# Patient Record
Sex: Female | Born: 1935 | Race: Black or African American | Hispanic: No
Health system: Southern US, Community
[De-identification: ages and names within clinical notes are randomized; demographics above are authoritative.]

---

## 2013-01-11 ENCOUNTER — Inpatient Hospital Stay
Admission: AD | Admit: 2013-01-11 | Discharge: 2013-02-07 | Disposition: A | Discharge status: Skilled Nursing Facility | Payer: Self-pay | Source: Ambulatory Visit | Attending: Internal Medicine | Admitting: Internal Medicine

## 2013-01-12 ENCOUNTER — Other Ambulatory Visit (HOSPITAL_COMMUNITY): Payer: Self-pay

## 2013-01-12 LAB — CBC
HEMATOCRIT: 31.2 % — AB (ref 36.0–46.0)
Hemoglobin: 9.8 g/dL — ABNORMAL LOW (ref 12.0–15.0)
MCH: 29.7 pg (ref 26.0–34.0)
MCHC: 31.4 g/dL (ref 30.0–36.0)
MCV: 94.5 fL (ref 78.0–100.0)
Platelets: 122 10*3/uL — ABNORMAL LOW (ref 150–400)
RBC: 3.3 MIL/uL — ABNORMAL LOW (ref 3.87–5.11)
RDW: 15.4 % (ref 11.5–15.5)
WBC: 6.6 10*3/uL (ref 4.0–10.5)

## 2013-01-12 LAB — COMPREHENSIVE METABOLIC PANEL
ALK PHOS: 276 U/L — AB (ref 39–117)
ALT: 28 U/L (ref 0–35)
AST: 43 U/L — ABNORMAL HIGH (ref 0–37)
Albumin: 3.2 g/dL — ABNORMAL LOW (ref 3.5–5.2)
BUN: 78 mg/dL — AB (ref 6–23)
CALCIUM: 9.7 mg/dL (ref 8.4–10.5)
CO2: 35 mEq/L — ABNORMAL HIGH (ref 19–32)
CREATININE: 1.38 mg/dL — AB (ref 0.50–1.10)
Chloride: 102 mEq/L (ref 96–112)
GFR calc non Af Amer: 34 mL/min — ABNORMAL LOW (ref >90)
GFR, EST AFRICAN AMERICAN: 40 mL/min — AB (ref >90)
GLUCOSE: 138 mg/dL — AB (ref 70–99)
POTASSIUM: 3.8 meq/L (ref 3.7–5.3)
Sodium: 146 mEq/L (ref 137–147)
TOTAL PROTEIN: 6.9 g/dL (ref 6.0–8.3)
Total Bilirubin: 0.5 mg/dL (ref 0.3–1.2)

## 2013-01-12 LAB — MAGNESIUM: Magnesium: 2.1 mg/dL (ref 1.5–2.5)

## 2013-01-12 LAB — CLOSTRIDIUM DIFFICILE BY PCR: Toxigenic C. Difficile by PCR: NEGATIVE

## 2013-01-12 LAB — PREALBUMIN: PREALBUMIN: 12.4 mg/dL — AB (ref 17.0–34.0)

## 2013-01-12 LAB — PROCALCITONIN: Procalcitonin: 0.17 ng/mL

## 2013-01-13 LAB — PROCALCITONIN: Procalcitonin: 0.14 ng/mL

## 2013-01-14 LAB — BASIC METABOLIC PANEL
BUN: 77 mg/dL — AB (ref 6–23)
CALCIUM: 9.8 mg/dL (ref 8.4–10.5)
CO2: 36 mEq/L — ABNORMAL HIGH (ref 19–32)
Chloride: 101 mEq/L (ref 96–112)
Creatinine, Ser: 1.31 mg/dL — ABNORMAL HIGH (ref 0.50–1.10)
GFR, EST AFRICAN AMERICAN: 42 mL/min — AB (ref >90)
GFR, EST NON AFRICAN AMERICAN: 37 mL/min — AB (ref >90)
GLUCOSE: 127 mg/dL — AB (ref 70–99)
Potassium: 3.7 mEq/L (ref 3.7–5.3)
Sodium: 145 mEq/L (ref 137–147)

## 2013-01-14 LAB — URINALYSIS, ROUTINE W REFLEX MICROSCOPIC
Bilirubin Urine: NEGATIVE
Glucose, UA: NEGATIVE mg/dL
Ketones, ur: NEGATIVE mg/dL
NITRITE: NEGATIVE
PH: 5 (ref 5.0–8.0)
Protein, ur: 100 mg/dL — AB
Specific Gravity, Urine: 1.014 (ref 1.005–1.030)
Urobilinogen, UA: 0.2 mg/dL (ref 0.0–1.0)

## 2013-01-14 LAB — URINE MICROSCOPIC-ADD ON

## 2013-01-16 LAB — URINE CULTURE: Colony Count: 60000

## 2013-01-17 LAB — COMPREHENSIVE METABOLIC PANEL
ALT: 60 U/L — ABNORMAL HIGH (ref 0–35)
AST: 59 U/L — ABNORMAL HIGH (ref 0–37)
Albumin: 3.2 g/dL — ABNORMAL LOW (ref 3.5–5.2)
Alkaline Phosphatase: 375 U/L — ABNORMAL HIGH (ref 39–117)
BUN: 98 mg/dL — ABNORMAL HIGH (ref 6–23)
CALCIUM: 9.7 mg/dL (ref 8.4–10.5)
CO2: 34 mEq/L — ABNORMAL HIGH (ref 19–32)
Chloride: 98 mEq/L (ref 96–112)
Creatinine, Ser: 1.83 mg/dL — ABNORMAL HIGH (ref 0.50–1.10)
GFR calc non Af Amer: 24 mL/min — ABNORMAL LOW (ref >90)
GFR, EST AFRICAN AMERICAN: 28 mL/min — AB (ref >90)
Glucose, Bld: 136 mg/dL — ABNORMAL HIGH (ref 70–99)
Potassium: 4.4 mEq/L (ref 3.7–5.3)
SODIUM: 142 meq/L (ref 137–147)
TOTAL PROTEIN: 7.1 g/dL (ref 6.0–8.3)
Total Bilirubin: 0.4 mg/dL (ref 0.3–1.2)

## 2013-01-17 LAB — CBC
HCT: 29.5 % — ABNORMAL LOW (ref 36.0–46.0)
Hemoglobin: 9.2 g/dL — ABNORMAL LOW (ref 12.0–15.0)
MCH: 30 pg (ref 26.0–34.0)
MCHC: 31.2 g/dL (ref 30.0–36.0)
MCV: 96.1 fL (ref 78.0–100.0)
PLATELETS: 125 10*3/uL — AB (ref 150–400)
RBC: 3.07 MIL/uL — ABNORMAL LOW (ref 3.87–5.11)
RDW: 16 % — AB (ref 11.5–15.5)
WBC: 8.3 10*3/uL (ref 4.0–10.5)

## 2013-01-20 DIAGNOSIS — R609 Edema, unspecified: Secondary | ICD-10-CM

## 2013-01-20 LAB — URINE MICROSCOPIC-ADD ON

## 2013-01-20 LAB — URINALYSIS, ROUTINE W REFLEX MICROSCOPIC
Bilirubin Urine: NEGATIVE
Glucose, UA: NEGATIVE mg/dL
KETONES UR: NEGATIVE mg/dL
Nitrite: NEGATIVE
Specific Gravity, Urine: 1.019 (ref 1.005–1.030)
Urobilinogen, UA: 0.2 mg/dL (ref 0.0–1.0)
pH: 6 (ref 5.0–8.0)

## 2013-01-20 LAB — BASIC METABOLIC PANEL
BUN: 109 mg/dL — ABNORMAL HIGH (ref 6–23)
CHLORIDE: 98 meq/L (ref 96–112)
CO2: 34 meq/L — AB (ref 19–32)
Calcium: 9.4 mg/dL (ref 8.4–10.5)
Creatinine, Ser: 1.84 mg/dL — ABNORMAL HIGH (ref 0.50–1.10)
GFR calc Af Amer: 28 mL/min — ABNORMAL LOW (ref >90)
GFR calc non Af Amer: 24 mL/min — ABNORMAL LOW (ref >90)
Glucose, Bld: 123 mg/dL — ABNORMAL HIGH (ref 70–99)
Potassium: 4.8 mEq/L (ref 3.7–5.3)
Sodium: 141 mEq/L (ref 137–147)

## 2013-01-20 NOTE — Progress Notes (Signed)
VASCULAR LAB PRELIMINARY  PRELIMINARY  PRELIMINARY  PRELIMINARY  Right upper extremity venous Doppler completed.    Preliminary report:  There is no DVT or SVT in the visualized veins of the right upper extremity. Zafir Schauer, RVT 01/20/2013, 4:30 PM

## 2013-01-21 ENCOUNTER — Other Ambulatory Visit (HOSPITAL_COMMUNITY): Payer: Self-pay

## 2013-01-21 LAB — CBC WITH DIFFERENTIAL/PLATELET
BASOS ABS: 0 10*3/uL (ref 0.0–0.1)
Basophils Relative: 0 % (ref 0–1)
Eosinophils Absolute: 0 10*3/uL (ref 0.0–0.7)
Eosinophils Relative: 0 % (ref 0–5)
HEMATOCRIT: 27.6 % — AB (ref 36.0–46.0)
HEMOGLOBIN: 9 g/dL — AB (ref 12.0–15.0)
LYMPHS PCT: 17 % (ref 12–46)
Lymphs Abs: 1.1 10*3/uL (ref 0.7–4.0)
MCH: 30.2 pg (ref 26.0–34.0)
MCHC: 32.6 g/dL (ref 30.0–36.0)
MCV: 92.6 fL (ref 78.0–100.0)
MONO ABS: 1 10*3/uL (ref 0.1–1.0)
Monocytes Relative: 15 % — ABNORMAL HIGH (ref 3–12)
Neutro Abs: 4.5 10*3/uL (ref 1.7–7.7)
Neutrophils Relative %: 68 % (ref 43–77)
Platelets: 140 10*3/uL — ABNORMAL LOW (ref 150–400)
RBC: 2.98 MIL/uL — ABNORMAL LOW (ref 3.87–5.11)
RDW: 16.1 % — ABNORMAL HIGH (ref 11.5–15.5)
WBC: 6.7 10*3/uL (ref 4.0–10.5)

## 2013-01-21 LAB — COMPREHENSIVE METABOLIC PANEL
ALK PHOS: 602 U/L — AB (ref 39–117)
ALT: 71 U/L — ABNORMAL HIGH (ref 0–35)
AST: 66 U/L — AB (ref 0–37)
Albumin: 3.3 g/dL — ABNORMAL LOW (ref 3.5–5.2)
BILIRUBIN TOTAL: 0.5 mg/dL (ref 0.3–1.2)
BUN: 114 mg/dL — ABNORMAL HIGH (ref 6–23)
CHLORIDE: 97 meq/L (ref 96–112)
CO2: 29 meq/L (ref 19–32)
CREATININE: 1.91 mg/dL — AB (ref 0.50–1.10)
Calcium: 9.7 mg/dL (ref 8.4–10.5)
GFR calc Af Amer: 27 mL/min — ABNORMAL LOW (ref >90)
GFR calc non Af Amer: 23 mL/min — ABNORMAL LOW (ref >90)
Glucose, Bld: 100 mg/dL — ABNORMAL HIGH (ref 70–99)
POTASSIUM: 4.6 meq/L (ref 3.7–5.3)
Sodium: 140 mEq/L (ref 137–147)
Total Protein: 7.5 g/dL (ref 6.0–8.3)

## 2013-01-21 LAB — URINE CULTURE
Colony Count: NO GROWTH
Culture: NO GROWTH

## 2013-01-21 LAB — PREALBUMIN: Prealbumin: 14.2 mg/dL — ABNORMAL LOW (ref 17.0–34.0)

## 2013-01-22 LAB — BASIC METABOLIC PANEL
BUN: 118 mg/dL — ABNORMAL HIGH (ref 6–23)
CALCIUM: 9.9 mg/dL (ref 8.4–10.5)
CO2: 33 mEq/L — ABNORMAL HIGH (ref 19–32)
CREATININE: 1.9 mg/dL — AB (ref 0.50–1.10)
Chloride: 101 mEq/L (ref 96–112)
GFR calc Af Amer: 27 mL/min — ABNORMAL LOW (ref >90)
GFR calc non Af Amer: 23 mL/min — ABNORMAL LOW (ref >90)
GLUCOSE: 73 mg/dL (ref 70–99)
Potassium: 4.6 mEq/L (ref 3.7–5.3)
Sodium: 145 mEq/L (ref 137–147)

## 2013-01-23 LAB — COMPREHENSIVE METABOLIC PANEL
ALBUMIN: 3.7 g/dL (ref 3.5–5.2)
ALK PHOS: 648 U/L — AB (ref 39–117)
ALT: 53 U/L — AB (ref 0–35)
AST: 51 U/L — ABNORMAL HIGH (ref 0–37)
BUN: 119 mg/dL — ABNORMAL HIGH (ref 6–23)
CALCIUM: 10.4 mg/dL (ref 8.4–10.5)
CO2: 31 mEq/L (ref 19–32)
Chloride: 100 mEq/L (ref 96–112)
Creatinine, Ser: 1.79 mg/dL — ABNORMAL HIGH (ref 0.50–1.10)
GFR calc Af Amer: 29 mL/min — ABNORMAL LOW (ref >90)
GFR calc non Af Amer: 25 mL/min — ABNORMAL LOW (ref >90)
Glucose, Bld: 69 mg/dL — ABNORMAL LOW (ref 70–99)
POTASSIUM: 4.5 meq/L (ref 3.7–5.3)
SODIUM: 145 meq/L (ref 137–147)
TOTAL PROTEIN: 7.7 g/dL (ref 6.0–8.3)
Total Bilirubin: 0.6 mg/dL (ref 0.3–1.2)

## 2013-01-23 LAB — CBC WITH DIFFERENTIAL/PLATELET
BASOS ABS: 0 10*3/uL (ref 0.0–0.1)
BASOS PCT: 0 % (ref 0–1)
EOS ABS: 0 10*3/uL (ref 0.0–0.7)
EOS PCT: 0 % (ref 0–5)
HCT: 27.9 % — ABNORMAL LOW (ref 36.0–46.0)
Hemoglobin: 9.2 g/dL — ABNORMAL LOW (ref 12.0–15.0)
Lymphocytes Relative: 10 % — ABNORMAL LOW (ref 12–46)
Lymphs Abs: 0.9 10*3/uL (ref 0.7–4.0)
MCH: 30.1 pg (ref 26.0–34.0)
MCHC: 33 g/dL (ref 30.0–36.0)
MCV: 91.2 fL (ref 78.0–100.0)
Monocytes Absolute: 1.3 10*3/uL — ABNORMAL HIGH (ref 0.1–1.0)
Monocytes Relative: 13 % — ABNORMAL HIGH (ref 3–12)
NEUTROS PCT: 77 % (ref 43–77)
Neutro Abs: 7.5 10*3/uL (ref 1.7–7.7)
PLATELETS: 160 10*3/uL (ref 150–400)
RBC: 3.06 MIL/uL — ABNORMAL LOW (ref 3.87–5.11)
RDW: 16.1 % — AB (ref 11.5–15.5)
WBC: 9.7 10*3/uL (ref 4.0–10.5)

## 2013-01-24 LAB — CBC
HCT: 29 % — ABNORMAL LOW (ref 36.0–46.0)
HEMOGLOBIN: 9.2 g/dL — AB (ref 12.0–15.0)
MCH: 29.5 pg (ref 26.0–34.0)
MCHC: 31.7 g/dL (ref 30.0–36.0)
MCV: 92.9 fL (ref 78.0–100.0)
Platelets: 162 10*3/uL (ref 150–400)
RBC: 3.12 MIL/uL — ABNORMAL LOW (ref 3.87–5.11)
RDW: 16.1 % — AB (ref 11.5–15.5)
WBC: 9.8 10*3/uL (ref 4.0–10.5)

## 2013-01-24 LAB — BASIC METABOLIC PANEL
BUN: 123 mg/dL — AB (ref 6–23)
CHLORIDE: 101 meq/L (ref 96–112)
CO2: 32 mEq/L (ref 19–32)
CREATININE: 1.78 mg/dL — AB (ref 0.50–1.10)
Calcium: 10.5 mg/dL (ref 8.4–10.5)
GFR, EST AFRICAN AMERICAN: 29 mL/min — AB (ref >90)
GFR, EST NON AFRICAN AMERICAN: 25 mL/min — AB (ref >90)
GLUCOSE: 141 mg/dL — AB (ref 70–99)
POTASSIUM: 4.4 meq/L (ref 3.7–5.3)
Sodium: 146 mEq/L (ref 137–147)

## 2013-01-25 ENCOUNTER — Other Ambulatory Visit (HOSPITAL_COMMUNITY): Payer: Self-pay

## 2013-01-25 LAB — COMPREHENSIVE METABOLIC PANEL
ALBUMIN: 4 g/dL (ref 3.5–5.2)
ALT: 67 U/L — ABNORMAL HIGH (ref 0–35)
AST: 72 U/L — ABNORMAL HIGH (ref 0–37)
Alkaline Phosphatase: 687 U/L — ABNORMAL HIGH (ref 39–117)
BUN: 128 mg/dL — AB (ref 6–23)
CALCIUM: 10.5 mg/dL (ref 8.4–10.5)
CO2: 31 mEq/L (ref 19–32)
Chloride: 102 mEq/L (ref 96–112)
Creatinine, Ser: 1.75 mg/dL — ABNORMAL HIGH (ref 0.50–1.10)
GFR calc non Af Amer: 26 mL/min — ABNORMAL LOW (ref >90)
GFR, EST AFRICAN AMERICAN: 30 mL/min — AB (ref >90)
GLUCOSE: 193 mg/dL — AB (ref 70–99)
POTASSIUM: 4.1 meq/L (ref 3.7–5.3)
Sodium: 150 mEq/L — ABNORMAL HIGH (ref 137–147)
TOTAL PROTEIN: 8.1 g/dL (ref 6.0–8.3)
Total Bilirubin: 0.7 mg/dL (ref 0.3–1.2)

## 2013-01-25 LAB — CBC
HEMATOCRIT: 29.7 % — AB (ref 36.0–46.0)
HEMOGLOBIN: 9.5 g/dL — AB (ref 12.0–15.0)
MCH: 29.2 pg (ref 26.0–34.0)
MCHC: 32 g/dL (ref 30.0–36.0)
MCV: 91.4 fL (ref 78.0–100.0)
Platelets: 162 10*3/uL (ref 150–400)
RBC: 3.25 MIL/uL — ABNORMAL LOW (ref 3.87–5.11)
RDW: 16 % — AB (ref 11.5–15.5)
WBC: 11 10*3/uL — ABNORMAL HIGH (ref 4.0–10.5)

## 2013-01-25 LAB — PRO B NATRIURETIC PEPTIDE: PRO B NATRI PEPTIDE: 43141 pg/mL — AB (ref 0–450)

## 2013-01-26 ENCOUNTER — Other Ambulatory Visit (HOSPITAL_COMMUNITY): Payer: Self-pay

## 2013-01-26 LAB — CBC
HCT: 29.1 % — ABNORMAL LOW (ref 36.0–46.0)
Hemoglobin: 9.3 g/dL — ABNORMAL LOW (ref 12.0–15.0)
MCH: 29.5 pg (ref 26.0–34.0)
MCHC: 32 g/dL (ref 30.0–36.0)
MCV: 92.4 fL (ref 78.0–100.0)
PLATELETS: 150 10*3/uL (ref 150–400)
RBC: 3.15 MIL/uL — ABNORMAL LOW (ref 3.87–5.11)
RDW: 16.3 % — AB (ref 11.5–15.5)
WBC: 12.4 10*3/uL — ABNORMAL HIGH (ref 4.0–10.5)

## 2013-01-26 LAB — BASIC METABOLIC PANEL
BUN: 135 mg/dL — ABNORMAL HIGH (ref 6–23)
CALCIUM: 10.4 mg/dL (ref 8.4–10.5)
CO2: 33 meq/L — AB (ref 19–32)
CREATININE: 1.79 mg/dL — AB (ref 0.50–1.10)
Chloride: 105 mEq/L (ref 96–112)
GFR calc Af Amer: 29 mL/min — ABNORMAL LOW (ref >90)
GFR calc non Af Amer: 25 mL/min — ABNORMAL LOW (ref >90)
GLUCOSE: 175 mg/dL — AB (ref 70–99)
Potassium: 3.8 mEq/L (ref 3.7–5.3)
Sodium: 152 mEq/L — ABNORMAL HIGH (ref 137–147)

## 2013-01-26 LAB — URINALYSIS, ROUTINE W REFLEX MICROSCOPIC
Bilirubin Urine: NEGATIVE
Glucose, UA: NEGATIVE mg/dL
HGB URINE DIPSTICK: NEGATIVE
KETONES UR: NEGATIVE mg/dL
Nitrite: NEGATIVE
PROTEIN: 30 mg/dL — AB
Specific Gravity, Urine: 1.014 (ref 1.005–1.030)
Urobilinogen, UA: 0.2 mg/dL (ref 0.0–1.0)
pH: 5 (ref 5.0–8.0)

## 2013-01-26 LAB — PHOSPHORUS: Phosphorus: 3.9 mg/dL (ref 2.3–4.6)

## 2013-01-26 LAB — URINE MICROSCOPIC-ADD ON

## 2013-01-26 LAB — MAGNESIUM: Magnesium: 3 mg/dL — ABNORMAL HIGH (ref 1.5–2.5)

## 2013-01-27 ENCOUNTER — Other Ambulatory Visit (HOSPITAL_COMMUNITY): Payer: Self-pay

## 2013-01-27 LAB — BASIC METABOLIC PANEL
BUN: 133 mg/dL — AB (ref 6–23)
CHLORIDE: 103 meq/L (ref 96–112)
CO2: 33 mEq/L — ABNORMAL HIGH (ref 19–32)
CREATININE: 1.73 mg/dL — AB (ref 0.50–1.10)
Calcium: 10.4 mg/dL (ref 8.4–10.5)
GFR calc non Af Amer: 26 mL/min — ABNORMAL LOW (ref >90)
GFR, EST AFRICAN AMERICAN: 30 mL/min — AB (ref >90)
Glucose, Bld: 154 mg/dL — ABNORMAL HIGH (ref 70–99)
Potassium: 3.4 mEq/L — ABNORMAL LOW (ref 3.7–5.3)
Sodium: 152 mEq/L — ABNORMAL HIGH (ref 137–147)

## 2013-01-27 LAB — CBC
HEMATOCRIT: 28.6 % — AB (ref 36.0–46.0)
Hemoglobin: 9.1 g/dL — ABNORMAL LOW (ref 12.0–15.0)
MCH: 29.6 pg (ref 26.0–34.0)
MCHC: 31.8 g/dL (ref 30.0–36.0)
MCV: 93.2 fL (ref 78.0–100.0)
Platelets: 150 10*3/uL (ref 150–400)
RBC: 3.07 MIL/uL — ABNORMAL LOW (ref 3.87–5.11)
RDW: 16.4 % — AB (ref 11.5–15.5)
WBC: 10.5 10*3/uL (ref 4.0–10.5)

## 2013-01-28 LAB — URINE CULTURE: Colony Count: 8000

## 2013-01-28 LAB — CBC WITH DIFFERENTIAL/PLATELET
BASOS ABS: 0 10*3/uL (ref 0.0–0.1)
BASOS PCT: 0 % (ref 0–1)
EOS ABS: 0.1 10*3/uL (ref 0.0–0.7)
EOS PCT: 1 % (ref 0–5)
HEMATOCRIT: 26.9 % — AB (ref 36.0–46.0)
Hemoglobin: 8.5 g/dL — ABNORMAL LOW (ref 12.0–15.0)
Lymphocytes Relative: 12 % (ref 12–46)
Lymphs Abs: 1 10*3/uL (ref 0.7–4.0)
MCH: 29.7 pg (ref 26.0–34.0)
MCHC: 31.6 g/dL (ref 30.0–36.0)
MCV: 94.1 fL (ref 78.0–100.0)
MONO ABS: 0.5 10*3/uL (ref 0.1–1.0)
Monocytes Relative: 6 % (ref 3–12)
NEUTROS ABS: 6.7 10*3/uL (ref 1.7–7.7)
Neutrophils Relative %: 81 % — ABNORMAL HIGH (ref 43–77)
Platelets: 123 10*3/uL — ABNORMAL LOW (ref 150–400)
RBC: 2.86 MIL/uL — ABNORMAL LOW (ref 3.87–5.11)
RDW: 16.4 % — AB (ref 11.5–15.5)
WBC: 8.3 10*3/uL (ref 4.0–10.5)

## 2013-01-28 LAB — BASIC METABOLIC PANEL
BUN: 131 mg/dL — ABNORMAL HIGH (ref 6–23)
CALCIUM: 9.9 mg/dL (ref 8.4–10.5)
CO2: 34 mEq/L — ABNORMAL HIGH (ref 19–32)
CREATININE: 1.78 mg/dL — AB (ref 0.50–1.10)
Chloride: 106 mEq/L (ref 96–112)
GFR calc non Af Amer: 25 mL/min — ABNORMAL LOW (ref >90)
GFR, EST AFRICAN AMERICAN: 29 mL/min — AB (ref >90)
Glucose, Bld: 168 mg/dL — ABNORMAL HIGH (ref 70–99)
Potassium: 3.9 mEq/L (ref 3.7–5.3)
Sodium: 152 mEq/L — ABNORMAL HIGH (ref 137–147)

## 2013-01-28 LAB — PHOSPHORUS: Phosphorus: 3.5 mg/dL (ref 2.3–4.6)

## 2013-01-28 LAB — MAGNESIUM: Magnesium: 2.8 mg/dL — ABNORMAL HIGH (ref 1.5–2.5)

## 2013-01-29 LAB — CBC
HCT: 28.2 % — ABNORMAL LOW (ref 36.0–46.0)
Hemoglobin: 8.9 g/dL — ABNORMAL LOW (ref 12.0–15.0)
MCH: 29.7 pg (ref 26.0–34.0)
MCHC: 31.6 g/dL (ref 30.0–36.0)
MCV: 94 fL (ref 78.0–100.0)
PLATELETS: 137 10*3/uL — AB (ref 150–400)
RBC: 3 MIL/uL — ABNORMAL LOW (ref 3.87–5.11)
RDW: 16.6 % — ABNORMAL HIGH (ref 11.5–15.5)
WBC: 9.5 10*3/uL (ref 4.0–10.5)

## 2013-01-29 LAB — BASIC METABOLIC PANEL
BUN: 129 mg/dL — ABNORMAL HIGH (ref 6–23)
CALCIUM: 9.9 mg/dL (ref 8.4–10.5)
CO2: 33 mEq/L — ABNORMAL HIGH (ref 19–32)
CREATININE: 1.65 mg/dL — AB (ref 0.50–1.10)
Chloride: 104 mEq/L (ref 96–112)
GFR calc Af Amer: 32 mL/min — ABNORMAL LOW (ref >90)
GFR, EST NON AFRICAN AMERICAN: 28 mL/min — AB (ref >90)
GLUCOSE: 162 mg/dL — AB (ref 70–99)
Potassium: 3.6 mEq/L — ABNORMAL LOW (ref 3.7–5.3)
Sodium: 150 mEq/L — ABNORMAL HIGH (ref 137–147)

## 2013-01-29 LAB — PHOSPHORUS: PHOSPHORUS: 3.5 mg/dL (ref 2.3–4.6)

## 2013-01-29 LAB — MAGNESIUM: MAGNESIUM: 2.9 mg/dL — AB (ref 1.5–2.5)

## 2013-01-30 LAB — COMPREHENSIVE METABOLIC PANEL
ALT: 57 U/L — ABNORMAL HIGH (ref 0–35)
AST: 53 U/L — AB (ref 0–37)
Albumin: 3.6 g/dL (ref 3.5–5.2)
Alkaline Phosphatase: 593 U/L — ABNORMAL HIGH (ref 39–117)
BUN: 123 mg/dL — ABNORMAL HIGH (ref 6–23)
CO2: 33 meq/L — AB (ref 19–32)
Calcium: 9.8 mg/dL (ref 8.4–10.5)
Chloride: 102 mEq/L (ref 96–112)
Creatinine, Ser: 1.55 mg/dL — ABNORMAL HIGH (ref 0.50–1.10)
GFR calc Af Amer: 35 mL/min — ABNORMAL LOW (ref >90)
GFR, EST NON AFRICAN AMERICAN: 30 mL/min — AB (ref >90)
Glucose, Bld: 145 mg/dL — ABNORMAL HIGH (ref 70–99)
Potassium: 3.2 mEq/L — ABNORMAL LOW (ref 3.7–5.3)
Sodium: 149 mEq/L — ABNORMAL HIGH (ref 137–147)
TOTAL PROTEIN: 7.3 g/dL (ref 6.0–8.3)
Total Bilirubin: 0.5 mg/dL (ref 0.3–1.2)

## 2013-01-30 LAB — CBC
HCT: 29.4 % — ABNORMAL LOW (ref 36.0–46.0)
Hemoglobin: 9.2 g/dL — ABNORMAL LOW (ref 12.0–15.0)
MCH: 29.7 pg (ref 26.0–34.0)
MCHC: 31.3 g/dL (ref 30.0–36.0)
MCV: 94.8 fL (ref 78.0–100.0)
Platelets: 135 10*3/uL — ABNORMAL LOW (ref 150–400)
RBC: 3.1 MIL/uL — AB (ref 3.87–5.11)
RDW: 16.5 % — ABNORMAL HIGH (ref 11.5–15.5)
WBC: 9.5 10*3/uL (ref 4.0–10.5)

## 2013-01-31 LAB — BASIC METABOLIC PANEL
BUN: 114 mg/dL — ABNORMAL HIGH (ref 6–23)
CHLORIDE: 104 meq/L (ref 96–112)
CO2: 34 meq/L — AB (ref 19–32)
CREATININE: 1.46 mg/dL — AB (ref 0.50–1.10)
Calcium: 10 mg/dL (ref 8.4–10.5)
GFR calc Af Amer: 37 mL/min — ABNORMAL LOW (ref >90)
GFR calc non Af Amer: 32 mL/min — ABNORMAL LOW (ref >90)
GLUCOSE: 130 mg/dL — AB (ref 70–99)
Potassium: 4 mEq/L (ref 3.7–5.3)
Sodium: 149 mEq/L — ABNORMAL HIGH (ref 137–147)

## 2013-02-01 LAB — CBC
HEMATOCRIT: 26.1 % — AB (ref 36.0–46.0)
Hemoglobin: 8.5 g/dL — ABNORMAL LOW (ref 12.0–15.0)
MCH: 30 pg (ref 26.0–34.0)
MCHC: 32.6 g/dL (ref 30.0–36.0)
MCV: 92.2 fL (ref 78.0–100.0)
Platelets: 106 10*3/uL — ABNORMAL LOW (ref 150–400)
RBC: 2.83 MIL/uL — ABNORMAL LOW (ref 3.87–5.11)
RDW: 16.4 % — AB (ref 11.5–15.5)
WBC: 6.8 10*3/uL (ref 4.0–10.5)

## 2013-02-01 LAB — BASIC METABOLIC PANEL
BUN: 108 mg/dL — AB (ref 6–23)
CO2: 34 mEq/L — ABNORMAL HIGH (ref 19–32)
CREATININE: 1.45 mg/dL — AB (ref 0.50–1.10)
Calcium: 9.8 mg/dL (ref 8.4–10.5)
Chloride: 101 mEq/L (ref 96–112)
GFR, EST AFRICAN AMERICAN: 37 mL/min — AB (ref >90)
GFR, EST NON AFRICAN AMERICAN: 32 mL/min — AB (ref >90)
Glucose, Bld: 108 mg/dL — ABNORMAL HIGH (ref 70–99)
Potassium: 3.8 mEq/L (ref 3.7–5.3)
Sodium: 146 mEq/L (ref 137–147)

## 2013-02-02 LAB — BASIC METABOLIC PANEL
BUN: 98 mg/dL — AB (ref 6–23)
CO2: 33 mEq/L — ABNORMAL HIGH (ref 19–32)
CREATININE: 1.33 mg/dL — AB (ref 0.50–1.10)
Calcium: 9.7 mg/dL (ref 8.4–10.5)
Chloride: 101 mEq/L (ref 96–112)
GFR calc non Af Amer: 36 mL/min — ABNORMAL LOW (ref >90)
GFR, EST AFRICAN AMERICAN: 42 mL/min — AB (ref >90)
Glucose, Bld: 124 mg/dL — ABNORMAL HIGH (ref 70–99)
Potassium: 3.6 mEq/L — ABNORMAL LOW (ref 3.7–5.3)
Sodium: 146 mEq/L (ref 137–147)

## 2013-02-03 LAB — POTASSIUM: POTASSIUM: 3.4 meq/L — AB (ref 3.7–5.3)

## 2013-02-04 LAB — PREALBUMIN
PREALBUMIN: 13.7 mg/dL — AB (ref 17.0–34.0)
Prealbumin: 13.8 mg/dL — ABNORMAL LOW (ref 17.0–34.0)

## 2013-02-04 LAB — COMPREHENSIVE METABOLIC PANEL
ALBUMIN: 3.6 g/dL (ref 3.5–5.2)
ALK PHOS: 883 U/L — AB (ref 39–117)
ALT: 178 U/L — ABNORMAL HIGH (ref 0–35)
AST: 160 U/L — ABNORMAL HIGH (ref 0–37)
BUN: 90 mg/dL — AB (ref 6–23)
CALCIUM: 10.1 mg/dL (ref 8.4–10.5)
CO2: 33 mEq/L — ABNORMAL HIGH (ref 19–32)
CREATININE: 1.22 mg/dL — AB (ref 0.50–1.10)
Chloride: 97 mEq/L (ref 96–112)
GFR calc non Af Amer: 40 mL/min — ABNORMAL LOW (ref >90)
GFR, EST AFRICAN AMERICAN: 46 mL/min — AB (ref >90)
Glucose, Bld: 77 mg/dL (ref 70–99)
POTASSIUM: 3.5 meq/L — AB (ref 3.7–5.3)
Sodium: 145 mEq/L (ref 137–147)
TOTAL PROTEIN: 8 g/dL (ref 6.0–8.3)
Total Bilirubin: 0.5 mg/dL (ref 0.3–1.2)

## 2013-02-04 LAB — CBC WITH DIFFERENTIAL/PLATELET
BASOS PCT: 0 % (ref 0–1)
Basophils Absolute: 0 10*3/uL (ref 0.0–0.1)
EOS PCT: 1 % (ref 0–5)
Eosinophils Absolute: 0.1 10*3/uL (ref 0.0–0.7)
HCT: 28 % — ABNORMAL LOW (ref 36.0–46.0)
Hemoglobin: 9 g/dL — ABNORMAL LOW (ref 12.0–15.0)
Lymphocytes Relative: 13 % (ref 12–46)
Lymphs Abs: 1.1 10*3/uL (ref 0.7–4.0)
MCH: 29.6 pg (ref 26.0–34.0)
MCHC: 32.1 g/dL (ref 30.0–36.0)
MCV: 92.1 fL (ref 78.0–100.0)
Monocytes Absolute: 0.9 10*3/uL (ref 0.1–1.0)
Monocytes Relative: 11 % (ref 3–12)
NEUTROS PCT: 75 % (ref 43–77)
Neutro Abs: 6.3 10*3/uL (ref 1.7–7.7)
Platelets: 123 10*3/uL — ABNORMAL LOW (ref 150–400)
RBC: 3.04 MIL/uL — ABNORMAL LOW (ref 3.87–5.11)
RDW: 17.1 % — ABNORMAL HIGH (ref 11.5–15.5)
WBC: 8.4 10*3/uL (ref 4.0–10.5)

## 2013-02-04 LAB — MAGNESIUM: MAGNESIUM: 2.3 mg/dL (ref 1.5–2.5)

## 2013-02-05 ENCOUNTER — Other Ambulatory Visit (HOSPITAL_COMMUNITY): Payer: Self-pay

## 2013-02-05 LAB — BASIC METABOLIC PANEL
BUN: 92 mg/dL — ABNORMAL HIGH (ref 6–23)
CO2: 32 mEq/L (ref 19–32)
Calcium: 9.7 mg/dL (ref 8.4–10.5)
Chloride: 99 mEq/L (ref 96–112)
Creatinine, Ser: 1.38 mg/dL — ABNORMAL HIGH (ref 0.50–1.10)
GFR, EST AFRICAN AMERICAN: 40 mL/min — AB (ref >90)
GFR, EST NON AFRICAN AMERICAN: 34 mL/min — AB (ref >90)
Glucose, Bld: 130 mg/dL — ABNORMAL HIGH (ref 70–99)
POTASSIUM: 4 meq/L (ref 3.7–5.3)
SODIUM: 144 meq/L (ref 137–147)

## 2013-02-05 LAB — HEPATITIS B CORE ANTIBODY, IGM: HEP B C IGM: NONREACTIVE

## 2013-02-05 LAB — HEPATITIS C ANTIBODY: HCV AB: NEGATIVE

## 2013-02-05 LAB — HEPATITIS B SURFACE ANTIGEN: Hepatitis B Surface Ag: NEGATIVE

## 2013-02-05 LAB — HEPATITIS B SURFACE ANTIBODY,QUALITATIVE: Hep B S Ab: POSITIVE — AB

## 2013-02-06 LAB — COMPREHENSIVE METABOLIC PANEL
ALT: 126 U/L — ABNORMAL HIGH (ref 0–35)
AST: 105 U/L — ABNORMAL HIGH (ref 0–37)
Albumin: 3.5 g/dL (ref 3.5–5.2)
Alkaline Phosphatase: 995 U/L — ABNORMAL HIGH (ref 39–117)
BUN: 92 mg/dL — ABNORMAL HIGH (ref 6–23)
CALCIUM: 10.3 mg/dL (ref 8.4–10.5)
CO2: 34 mEq/L — ABNORMAL HIGH (ref 19–32)
CREATININE: 1.31 mg/dL — AB (ref 0.50–1.10)
Chloride: 99 mEq/L (ref 96–112)
GFR calc non Af Amer: 37 mL/min — ABNORMAL LOW (ref >90)
GFR, EST AFRICAN AMERICAN: 42 mL/min — AB (ref >90)
Glucose, Bld: 146 mg/dL — ABNORMAL HIGH (ref 70–99)
Potassium: 3.6 mEq/L — ABNORMAL LOW (ref 3.7–5.3)
Sodium: 145 mEq/L (ref 137–147)
TOTAL PROTEIN: 7.6 g/dL (ref 6.0–8.3)
Total Bilirubin: 0.5 mg/dL (ref 0.3–1.2)

## 2013-02-07 LAB — CBC WITH DIFFERENTIAL/PLATELET
Basophils Absolute: 0 10*3/uL (ref 0.0–0.1)
Basophils Relative: 0 % (ref 0–1)
Eosinophils Absolute: 0 10*3/uL (ref 0.0–0.7)
Eosinophils Relative: 0 % (ref 0–5)
HEMATOCRIT: 27 % — AB (ref 36.0–46.0)
HEMOGLOBIN: 8.9 g/dL — AB (ref 12.0–15.0)
LYMPHS ABS: 1.1 10*3/uL (ref 0.7–4.0)
LYMPHS PCT: 12 % (ref 12–46)
MCH: 30 pg (ref 26.0–34.0)
MCHC: 33 g/dL (ref 30.0–36.0)
MCV: 90.9 fL (ref 78.0–100.0)
MONOS PCT: 9 % (ref 3–12)
Monocytes Absolute: 0.9 10*3/uL (ref 0.1–1.0)
NEUTROS ABS: 7.2 10*3/uL (ref 1.7–7.7)
NEUTROS PCT: 79 % — AB (ref 43–77)
Platelets: 147 10*3/uL — ABNORMAL LOW (ref 150–400)
RBC: 2.97 MIL/uL — AB (ref 3.87–5.11)
RDW: 17.2 % — ABNORMAL HIGH (ref 11.5–15.5)
WBC: 9.2 10*3/uL (ref 4.0–10.5)

## 2013-02-07 LAB — BASIC METABOLIC PANEL
BUN: 86 mg/dL — AB (ref 6–23)
CO2: 34 meq/L — AB (ref 19–32)
Calcium: 10 mg/dL (ref 8.4–10.5)
Chloride: 100 mEq/L (ref 96–112)
Creatinine, Ser: 1.2 mg/dL — ABNORMAL HIGH (ref 0.50–1.10)
GFR calc non Af Amer: 41 mL/min — ABNORMAL LOW (ref >90)
GFR, EST AFRICAN AMERICAN: 47 mL/min — AB (ref >90)
GLUCOSE: 127 mg/dL — AB (ref 70–99)
POTASSIUM: 3.6 meq/L — AB (ref 3.7–5.3)
Sodium: 147 mEq/L (ref 137–147)

## 2013-04-10 DEATH — deceased

## 2014-10-28 IMAGING — US US RENAL
1 series · 14 of 25 positions shown · non-contrast
Comparison: None.

CLINICAL DATA: Re-evaluation of acute renal insufficiency.

EXAM:
RENAL/URINARY TRACT ULTRASOUND COMPLETE

[Series 1: us renal · 0.27mm/px · 14 of 29 slices shown]
[im 1/29]
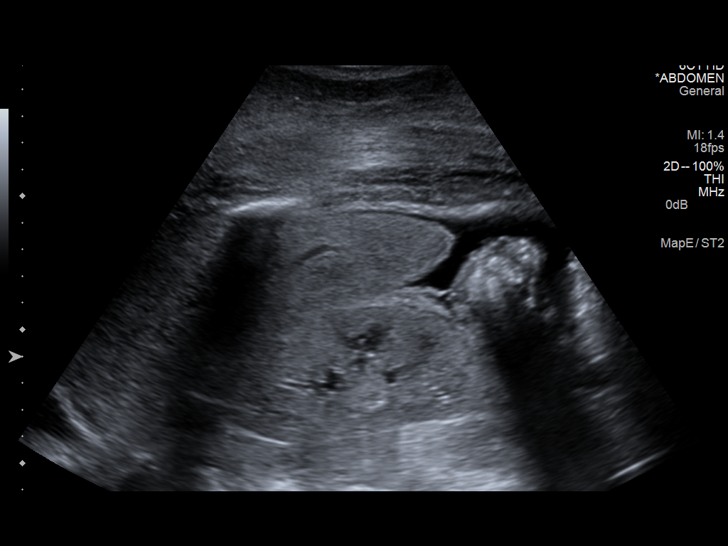
[im 3/29]
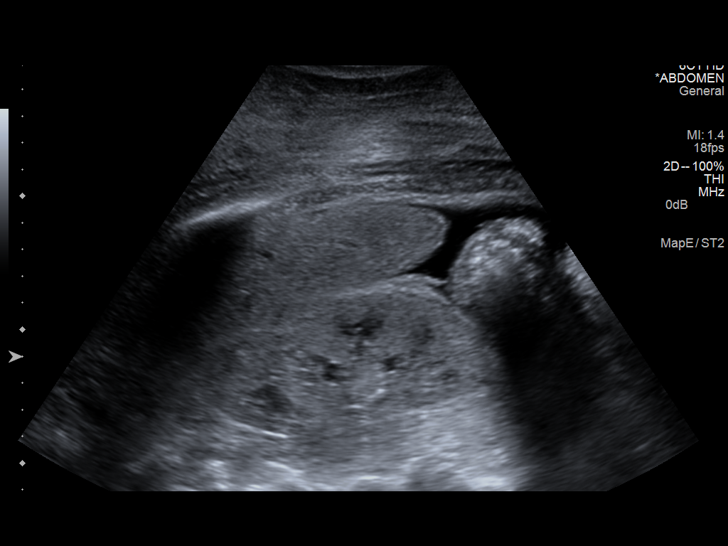
[im 5/29]
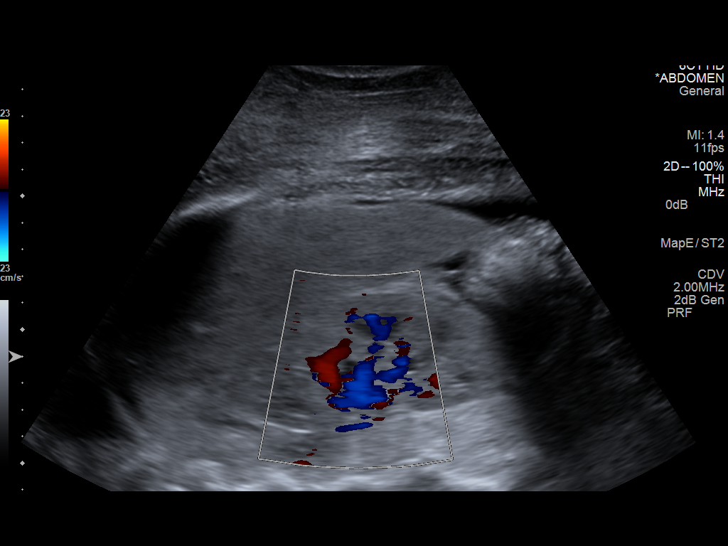
[im 8/29]
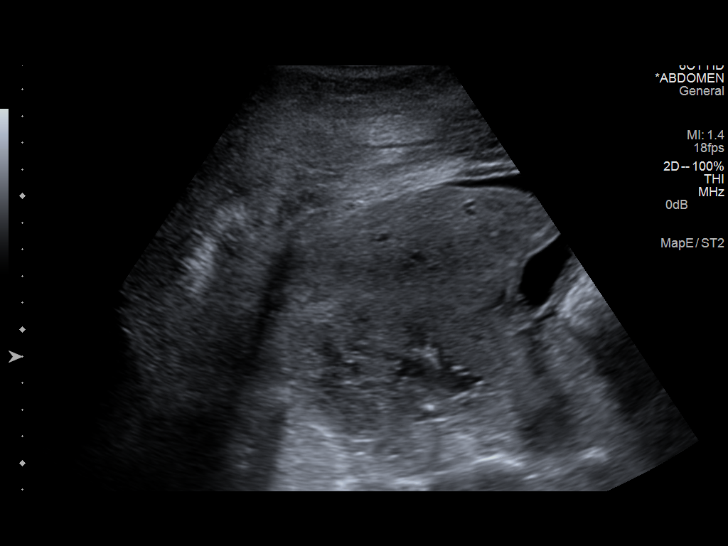
[im 10/29]
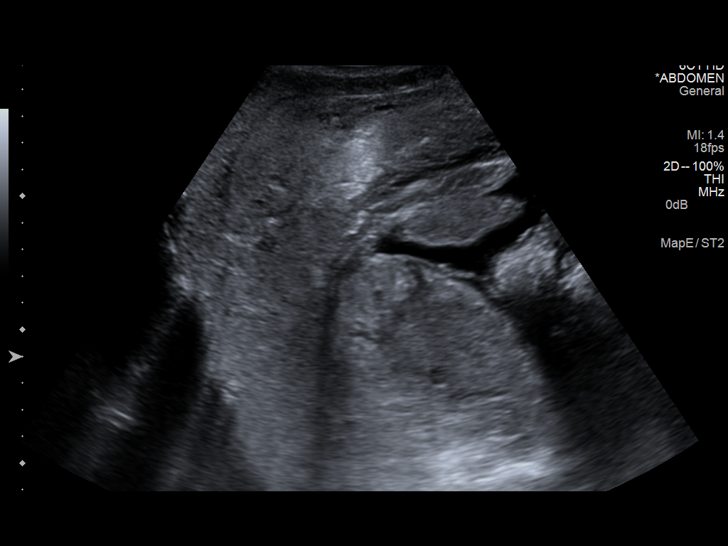
[im 11/29]
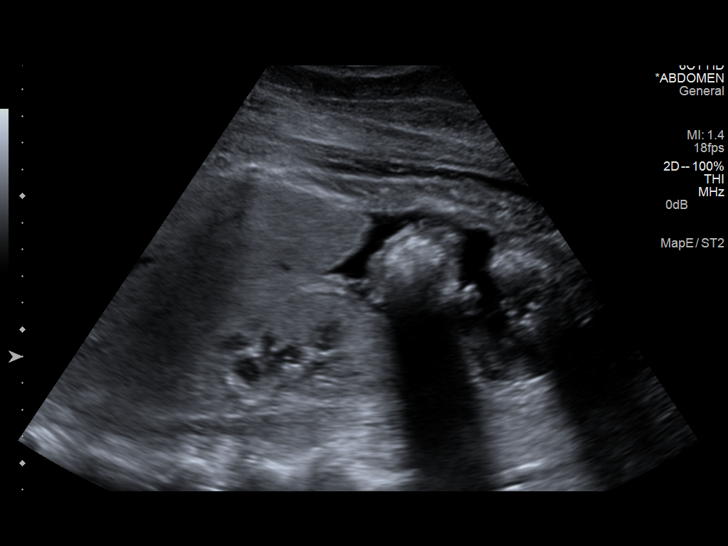
[im 13/29]
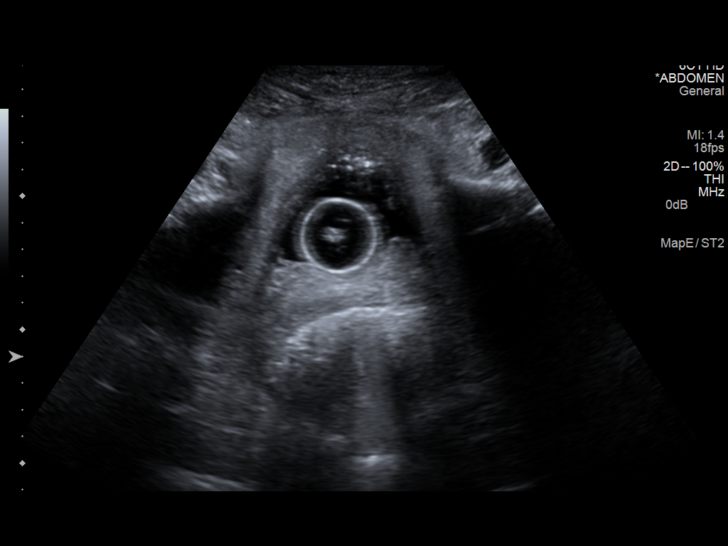
[im 16/29]
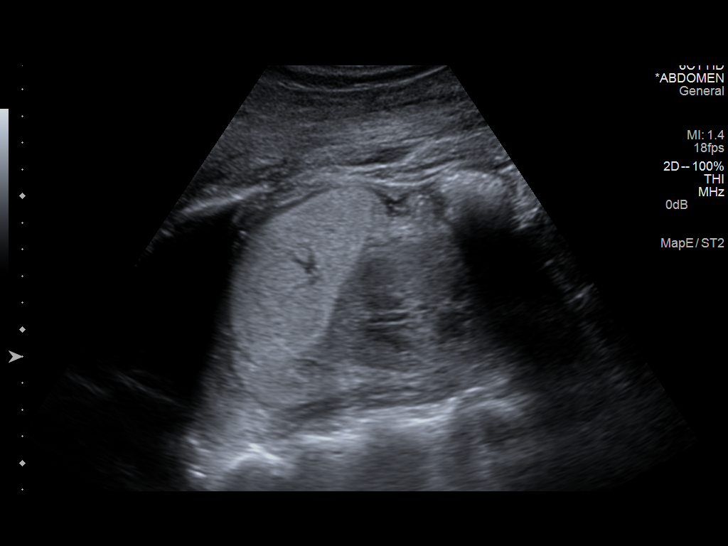
[im 18/29]
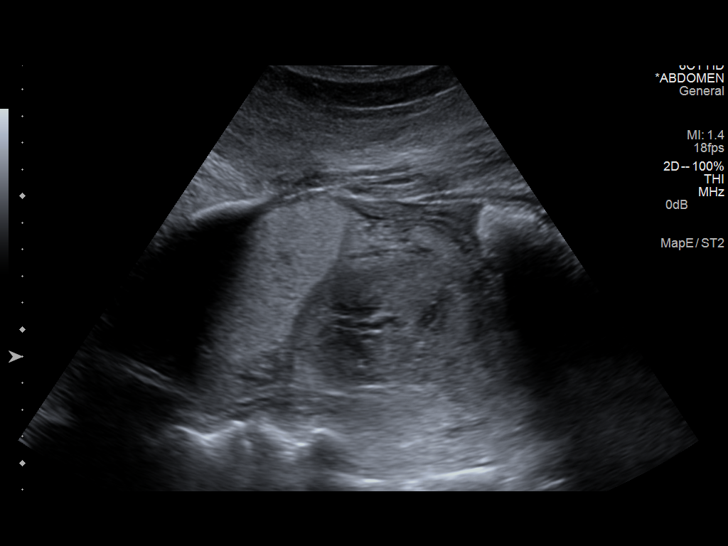
[im 19/29]
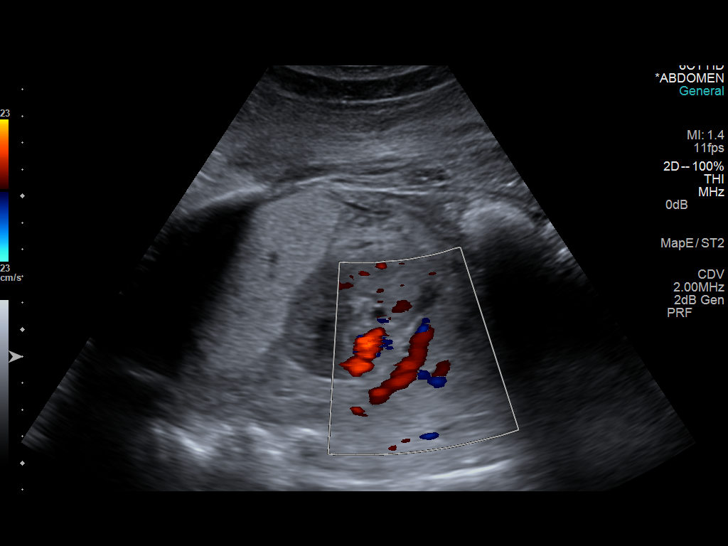
[im 22/29]
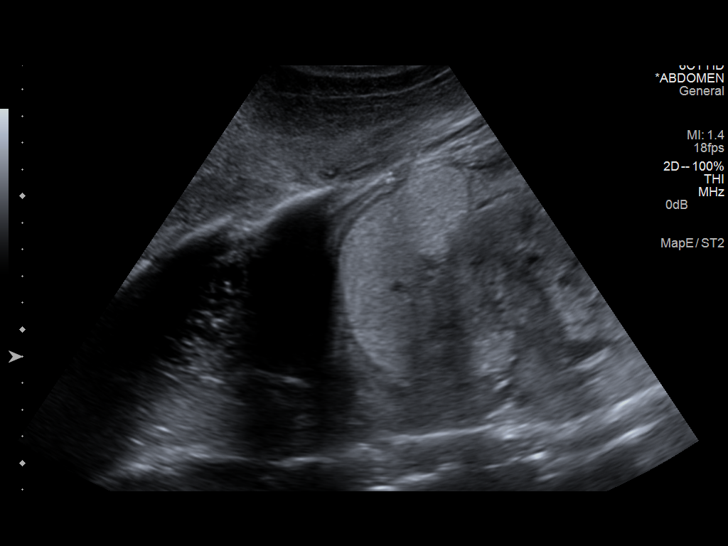
[im 24/29]
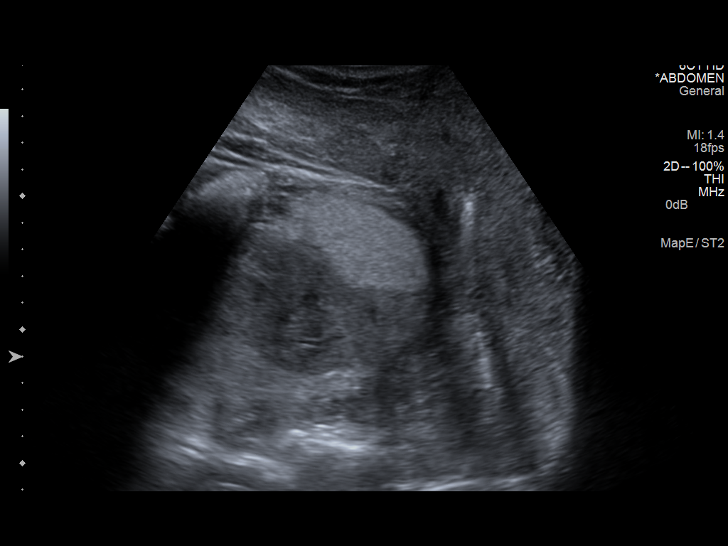
[im 26/29]
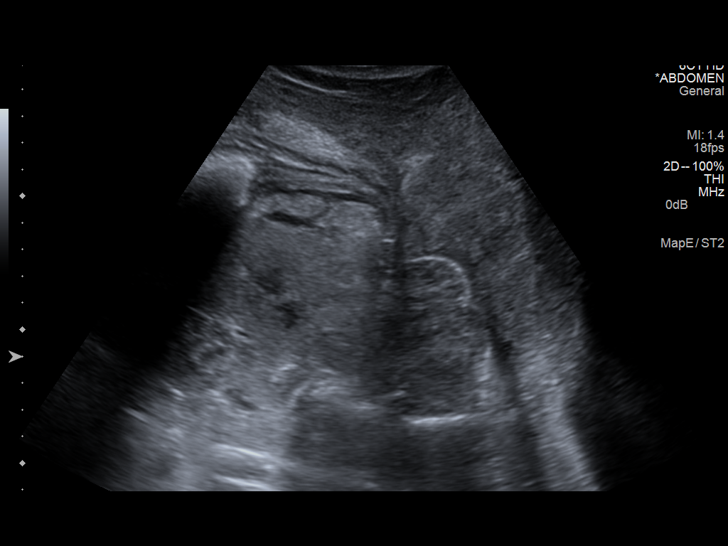
[im 29/29]
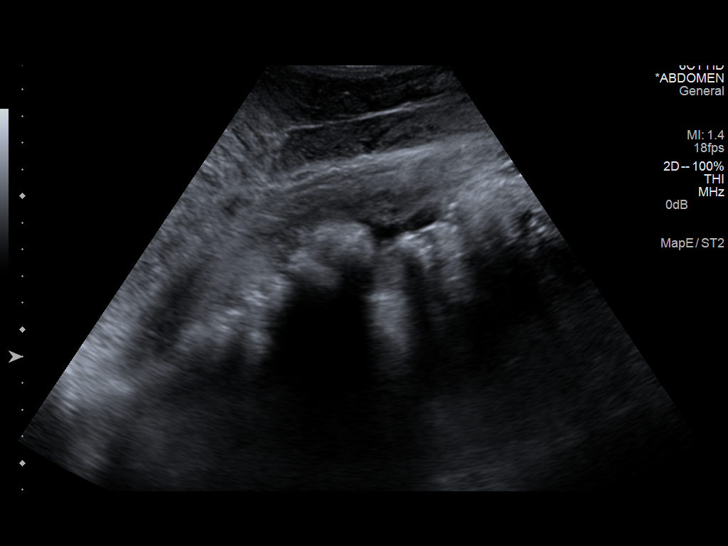

[14 of 25 positions shown; findings below may reference images not displayed]

FINDINGS: Right Kidney:

Length: 11.7 cm.. The echotexture of the right kidney is diffusely
increased in equal to that of the adjacent liver. There is no
hydronephrosis nor evidence of a mass.

Left Kidney:

Length: 10.3 cm. The left renal echotexture is diffusely increased.
There is no hydronephrosis on the left.

Bladder:

The urinary bladder is decompressed with a Foley catheter.

There is a small amount of ascites. There are bilateral pleural
effusions.
IMPRESSION: 1. The echotexture of the kidneys is diffusely increased consistent
with medical renal disease. There is no evidence of obstruction.
2. There is small amount of ascites. There are bilateral pleural
effusions.

## 2014-11-01 IMAGING — CR DG CHEST 1V PORT
1 series · 1 of 1 positions shown · non-contrast
Comparison: 01/13/2011.

CLINICAL DATA: Shortness of breath.  Pneumonia.

EXAM:
PORTABLE CHEST - 1 VIEW

[AP]
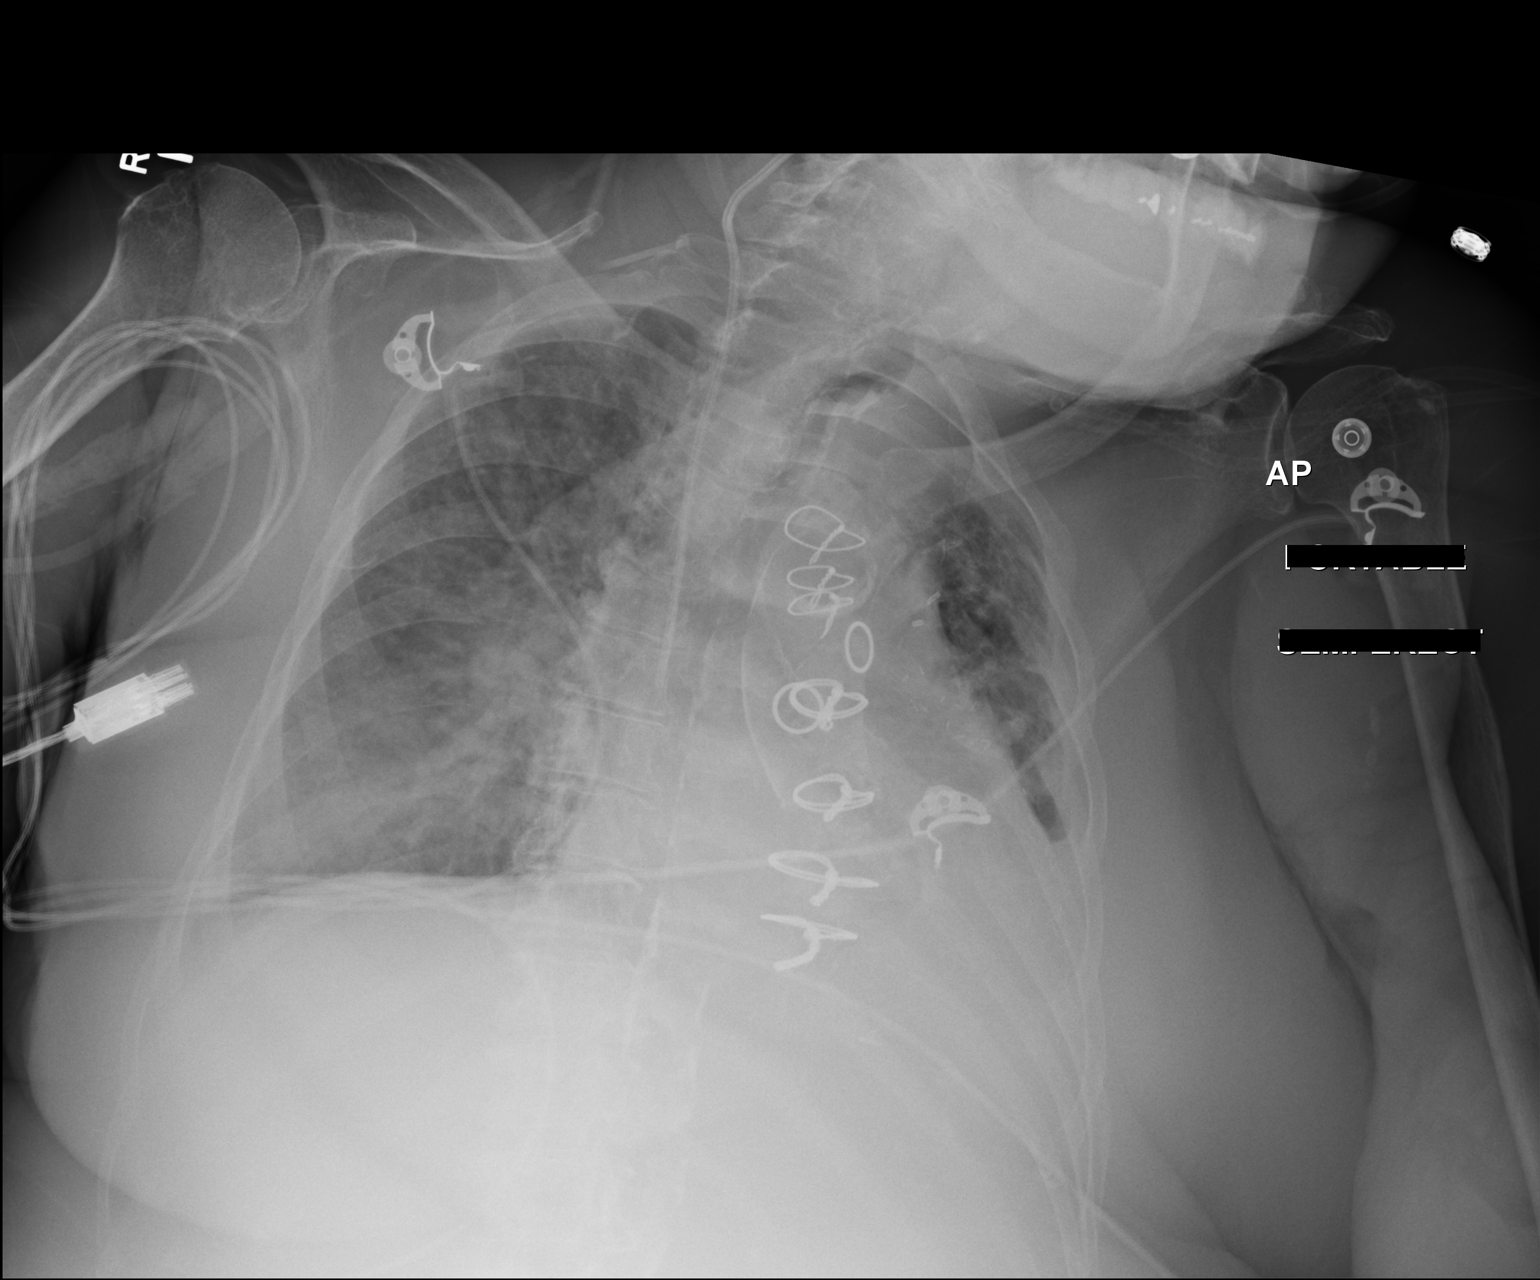

[1 of 1 positions shown; findings below may reference images not displayed]

FINDINGS: Right IJ tube in good anatomic position. Prior CABG. Mild
cardiomegaly. Pulmonary venous congestion and bilateral interstitial
prominence consistent with congestive heart failure with pulmonary
edema. Small left pleural effusion. Left base atelectasis and or
infiltrate. No pneumothorax. No acute osseous abnormality.
IMPRESSION: 1. Interim development of congestive heart failure with pulmonary
interstitial edema. Superimposed pneumonitis cannot be excluded.

2. Left-sided small pleural effusion and left lower lobe atelectasis
and/or infiltrate unchanged.

## 2014-11-02 IMAGING — CR DG CHEST 1V PORT
1 series · 1 of 1 positions shown · non-contrast
Comparison: Chest x-ray 01/25/2013.

CLINICAL DATA: Aspiration.  Code Blue.

EXAM:
PORTABLE CHEST - 1 VIEW

[AP]
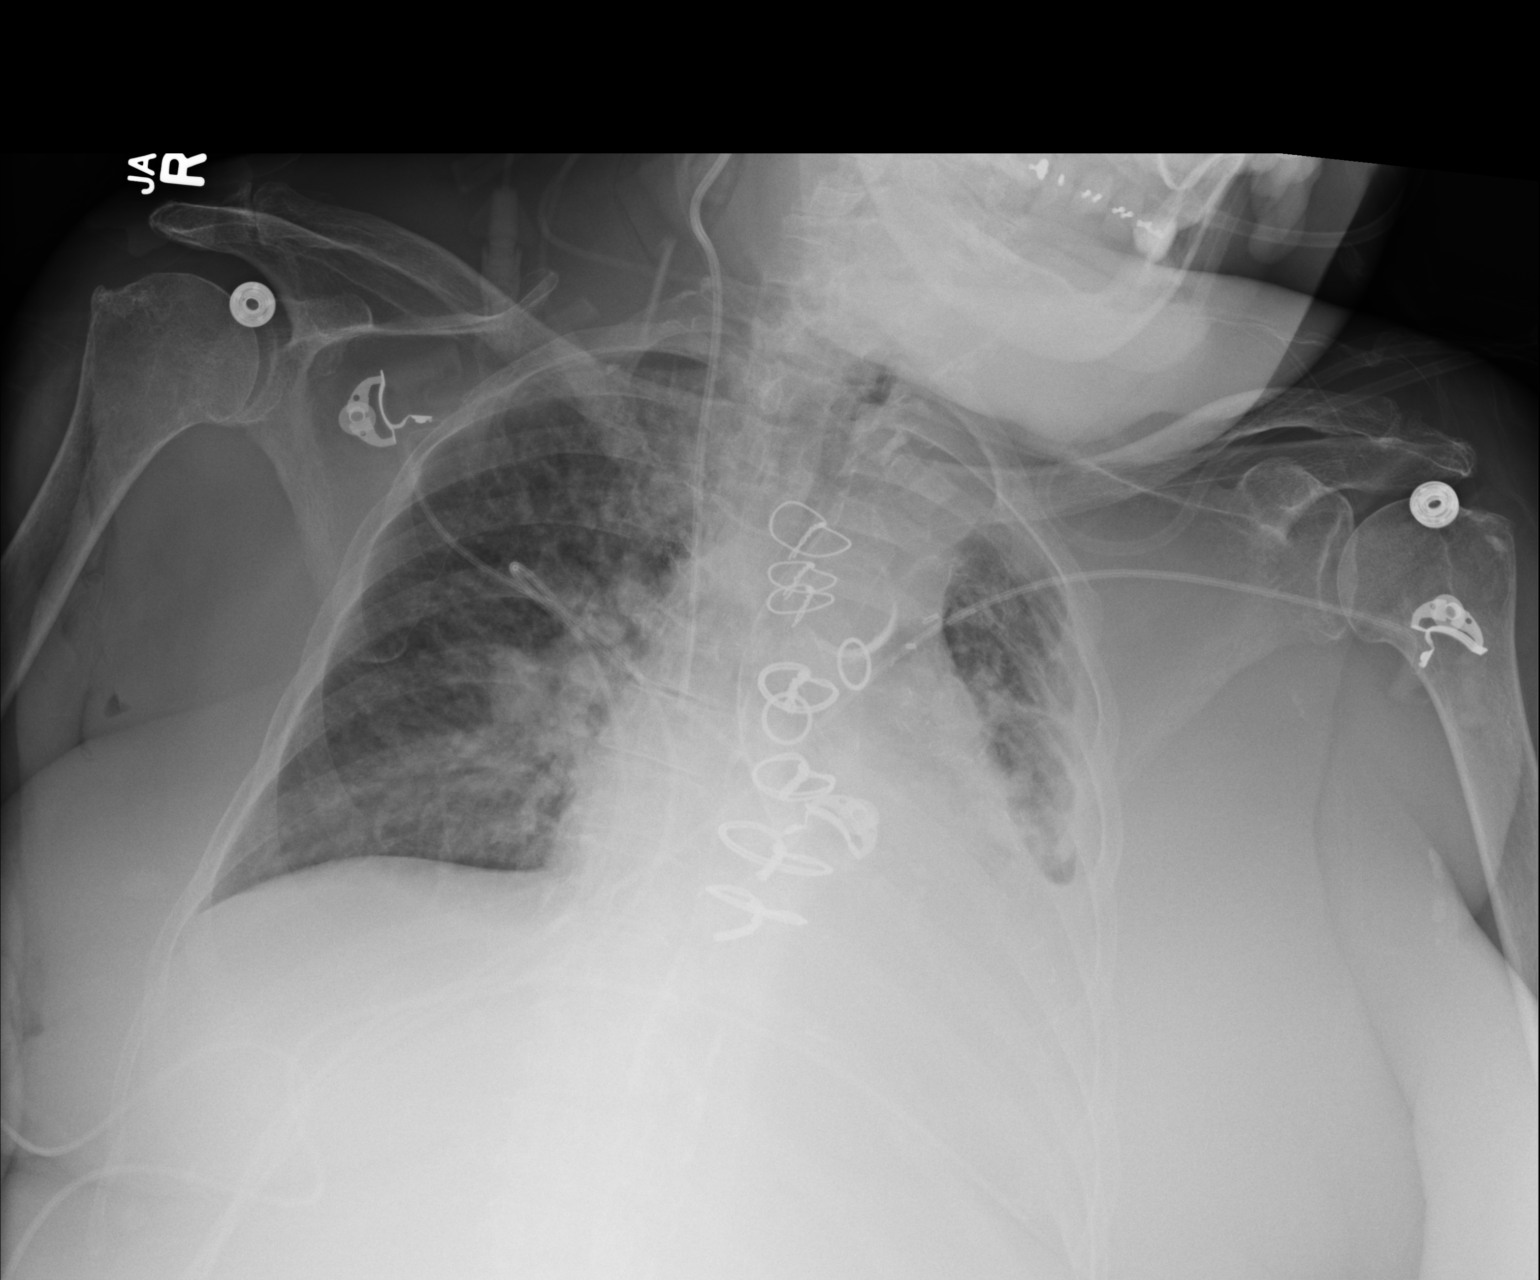

[1 of 1 positions shown; findings below may reference images not displayed]

FINDINGS: Right internal jugular central venous catheter with tip terminating
in the distal superior vena cava. Lung volumes are low. Extensive
opacification at the base of the left hemithorax compatible with
extensive atelectasis and/or consolidation with superimposed
moderate to large left pleural effusion. There is also extensive
atelectasis and/or consolidation in the right lower lobe is well.
Cephalization of the pulmonary vasculature, without frank pulmonary
edema. Heart size is mildly enlarged. Mediastinal contours are
grossly distorted by patient's rotation to the left. Severe
atherosclerosis in the thoracic aorta. Status post median sternotomy
for CABG.
IMPRESSION: 1. Allowing for slight differences in patient positioning, the
primary change between the prior examination and today's examination
is resolution of edema, although there continues to be extensive
cephalization of the pulmonary vasculature. Otherwise, the
appearance the chest is similar, as discussed above.

## 2014-11-12 IMAGING — US US ABDOMEN LIMITED
1 series · 14 of 25 positions shown · non-contrast
Comparison: US RENAL dated 01/21/2013

CLINICAL DATA: Elevated LFTs.

EXAM:
US ABDOMEN LIMITED - RIGHT UPPER QUADRANT

[Series 1: us abdomen limited · 0.24mm/px · 14 of 35 slices shown]
[im 1/35]
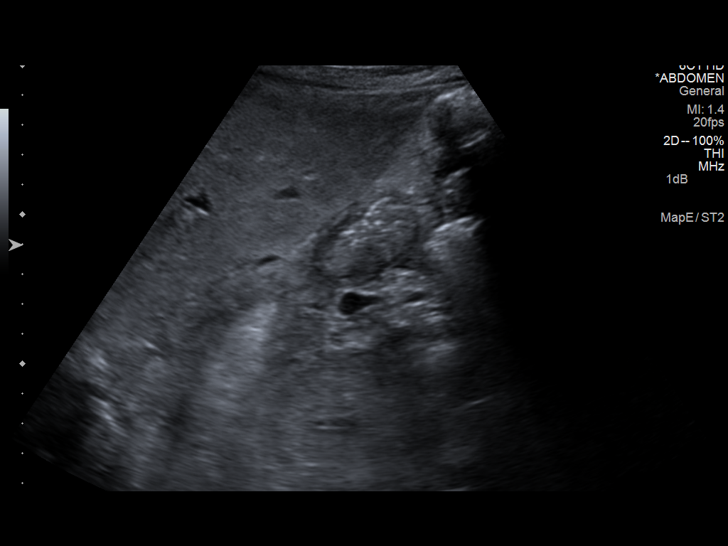
[im 3/35]
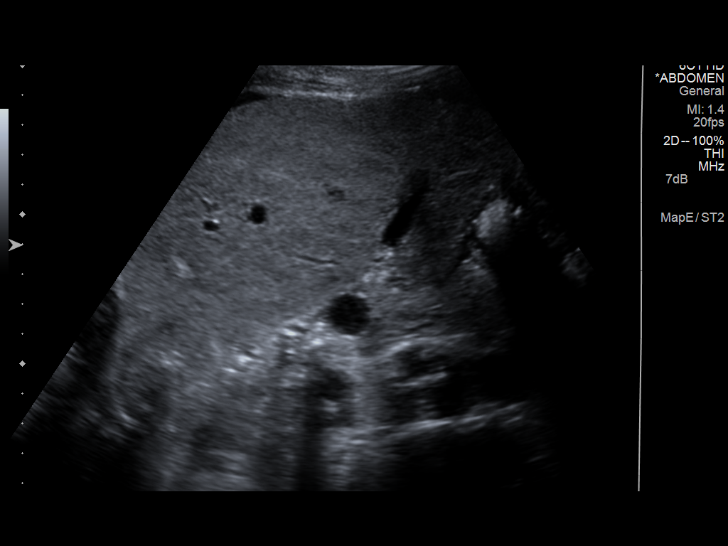
[im 6/35]
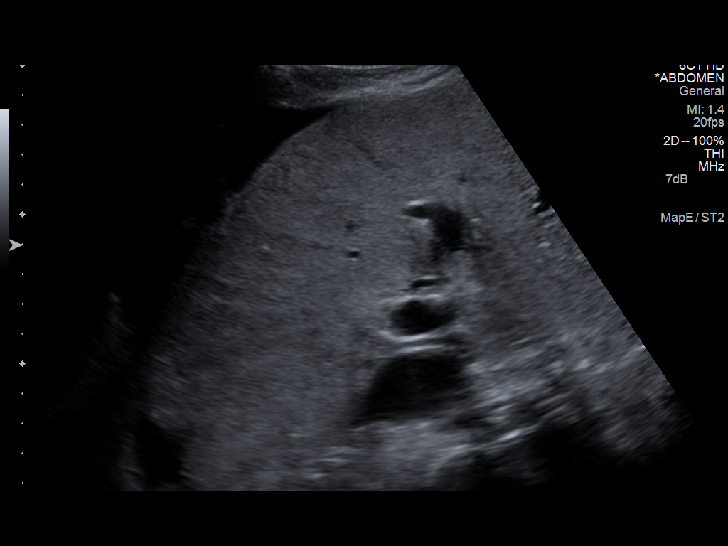
[im 9/35]
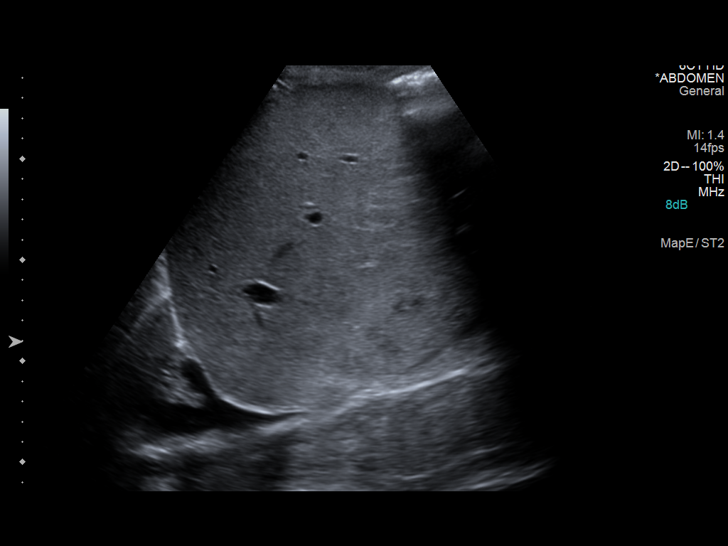
[im 12/35]
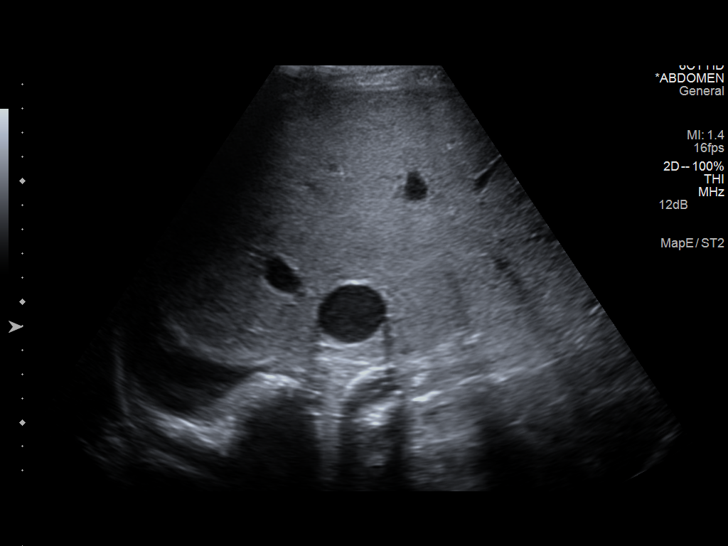
[im 13/35]
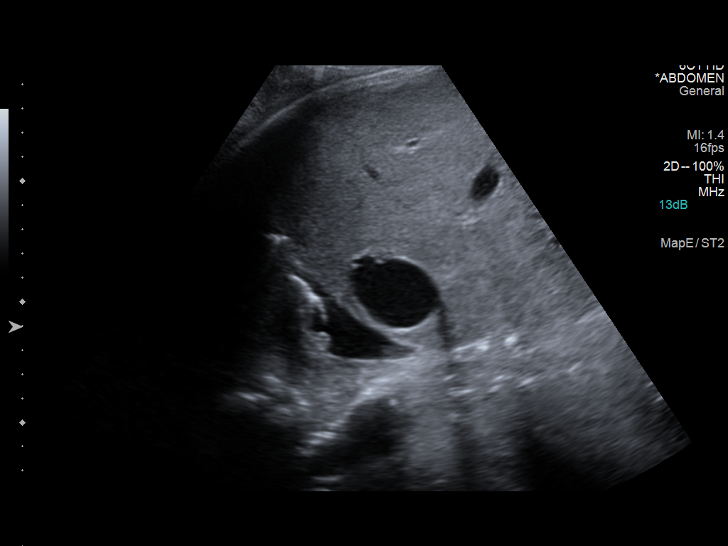
[im 16/35]
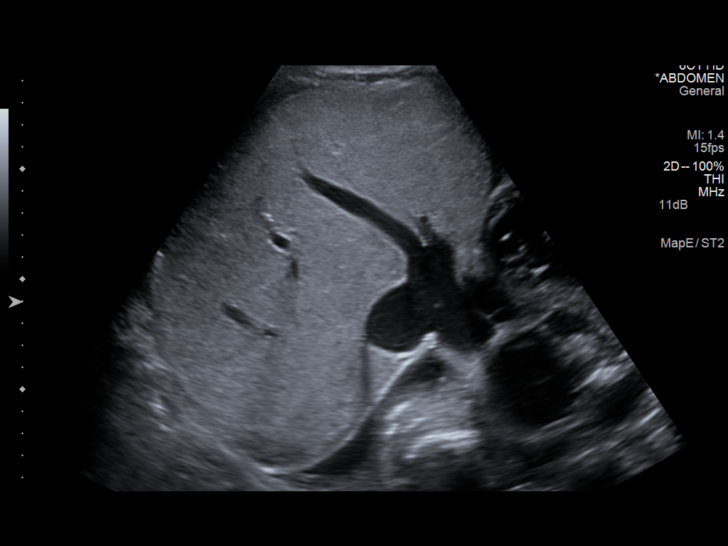
[im 19/35]
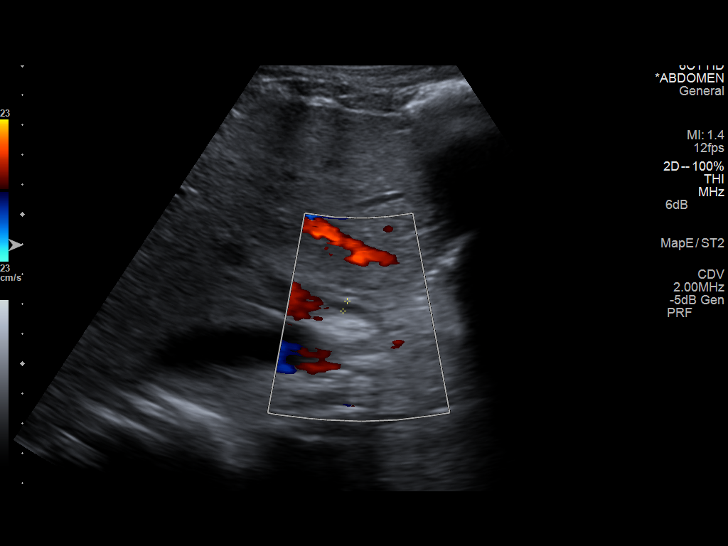
[im 22/35]
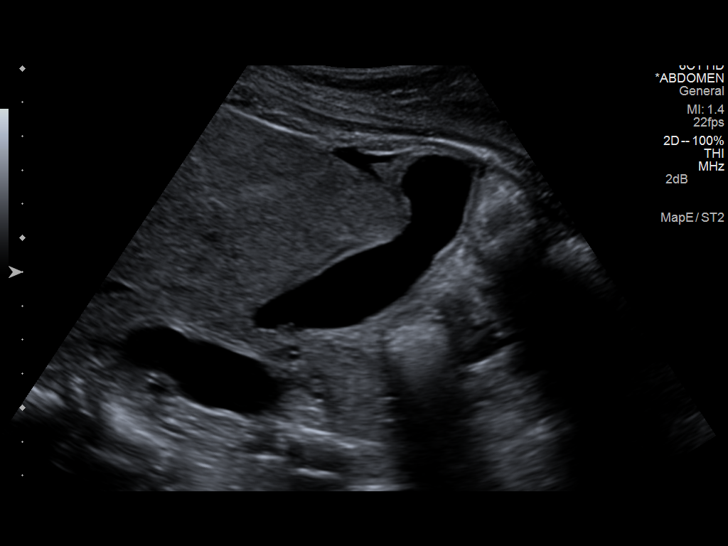
[im 23/35]
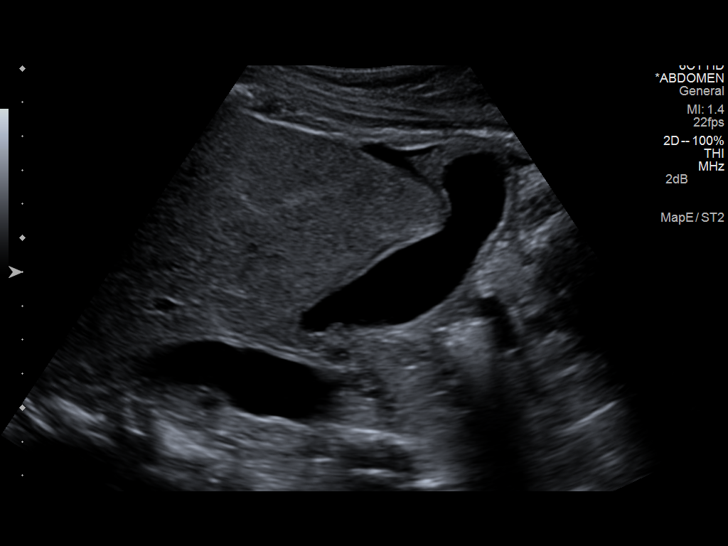
[im 26/35]
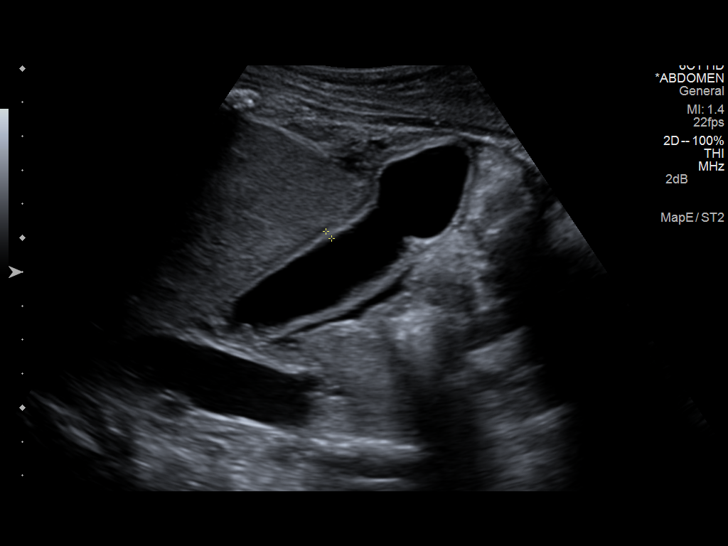
[im 29/35]
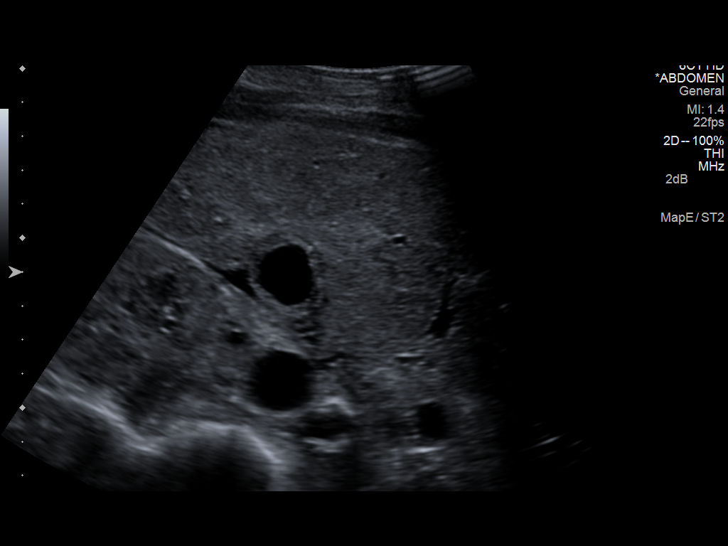
[im 32/35]
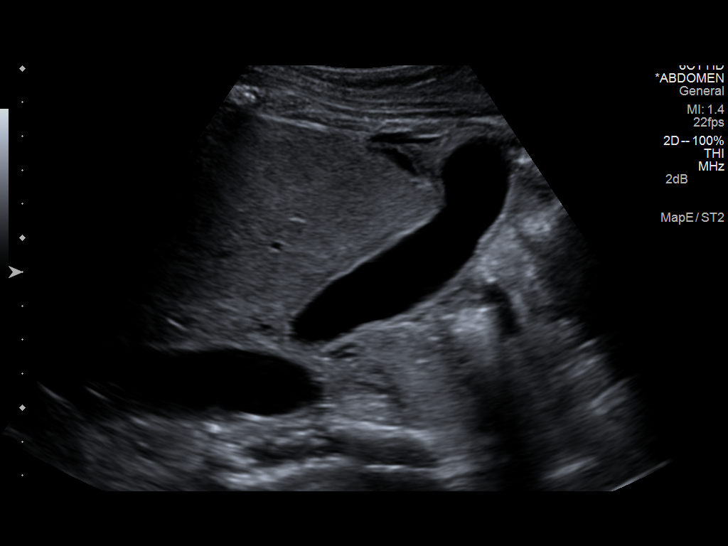
[im 35/35]
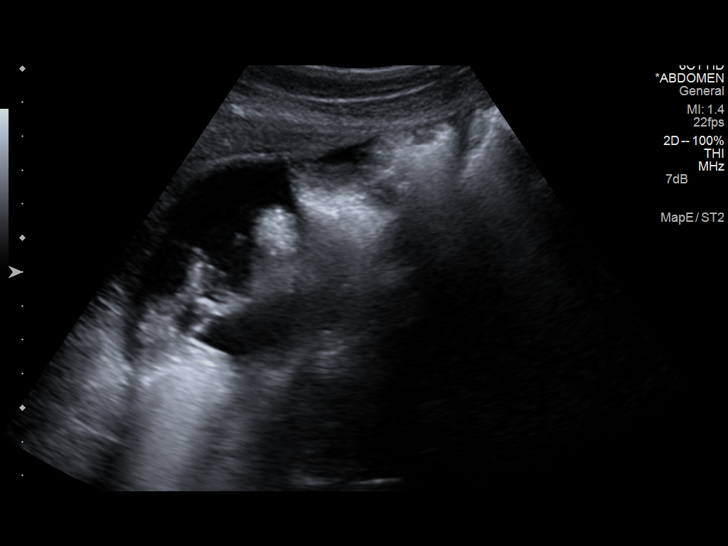

[14 of 25 positions shown; findings below may reference images not displayed]

FINDINGS: Gallbladder:

No gallstones or wall thickening visualized. No sonographic Murphy
sign noted.

Common bile duct:

Diameter: 3.6 mm.

Liver:

No focal lesion identified. Within normal limits in parenchymal
echogenicity.

Right pleural effusion and ascites noted.
IMPRESSION: 1. No sonographic evidence of hepato biliary disease.
2. Right pleural effusion and ascites.
